# Patient Record
Sex: Male | Born: 1985 | Race: Black or African American | Hispanic: No | Marital: Single | Smoking: Never smoker
Health system: Southern US, Community
[De-identification: ages and names within clinical notes are randomized; demographics above are authoritative.]

## PROBLEM LIST (undated history)

## (undated) DIAGNOSIS — I10 Essential (primary) hypertension: Secondary | ICD-10-CM

## (undated) HISTORY — PX: HAND TENDON SURGERY: SHX663

## (undated) HISTORY — PX: ARTHROSCOPIC REPAIR ACL: SUR80

---

## 2010-02-21 ENCOUNTER — Emergency Department (HOSPITAL_COMMUNITY)
Admission: EM | Admit: 2010-02-21 | Discharge: 2010-02-22 | Disposition: A | Discharge status: Home / Self Care | Payer: BC Managed Care – PPO | Attending: Emergency Medicine | Admitting: Emergency Medicine

## 2010-02-21 ENCOUNTER — Emergency Department (HOSPITAL_COMMUNITY): Payer: BC Managed Care – PPO

## 2010-02-21 DIAGNOSIS — Y929 Unspecified place or not applicable: Secondary | ICD-10-CM | POA: Insufficient documentation

## 2010-02-21 DIAGNOSIS — S62639A Displaced fracture of distal phalanx of unspecified finger, initial encounter for closed fracture: Secondary | ICD-10-CM | POA: Insufficient documentation

## 2010-02-21 DIAGNOSIS — Z1881 Retained glass fragments: Secondary | ICD-10-CM | POA: Insufficient documentation

## 2010-02-21 DIAGNOSIS — S61409A Unspecified open wound of unspecified hand, initial encounter: Secondary | ICD-10-CM | POA: Insufficient documentation

## 2010-02-21 DIAGNOSIS — S61209A Unspecified open wound of unspecified finger without damage to nail, initial encounter: Secondary | ICD-10-CM | POA: Insufficient documentation

## 2010-02-21 DIAGNOSIS — S6710XA Crushing injury of unspecified finger(s), initial encounter: Secondary | ICD-10-CM | POA: Insufficient documentation

## 2010-02-24 ENCOUNTER — Encounter (HOSPITAL_BASED_OUTPATIENT_CLINIC_OR_DEPARTMENT_OTHER)
Admission: RE | Admit: 2010-02-24 | Discharge: 2010-02-24 | Disposition: A | Discharge status: Home / Self Care | Payer: BC Managed Care – PPO | Source: Ambulatory Visit | Attending: Orthopedic Surgery | Admitting: Orthopedic Surgery

## 2010-02-24 DIAGNOSIS — Z01810 Encounter for preprocedural cardiovascular examination: Secondary | ICD-10-CM | POA: Insufficient documentation

## 2010-02-25 ENCOUNTER — Ambulatory Visit (HOSPITAL_BASED_OUTPATIENT_CLINIC_OR_DEPARTMENT_OTHER)
Admission: RE | Admit: 2010-02-25 | Discharge: 2010-02-25 | Disposition: A | Discharge status: Home / Self Care | Payer: BC Managed Care – PPO | Source: Ambulatory Visit | Attending: Orthopedic Surgery | Admitting: Orthopedic Surgery

## 2010-02-25 DIAGNOSIS — Z1881 Retained glass fragments: Secondary | ICD-10-CM | POA: Insufficient documentation

## 2010-02-25 DIAGNOSIS — W268XXA Contact with other sharp object(s), not elsewhere classified, initial encounter: Secondary | ICD-10-CM | POA: Insufficient documentation

## 2010-02-25 DIAGNOSIS — Z01812 Encounter for preprocedural laboratory examination: Secondary | ICD-10-CM | POA: Insufficient documentation

## 2010-02-25 DIAGNOSIS — Y998 Other external cause status: Secondary | ICD-10-CM | POA: Insufficient documentation

## 2010-02-25 DIAGNOSIS — S61209A Unspecified open wound of unspecified finger without damage to nail, initial encounter: Secondary | ICD-10-CM | POA: Insufficient documentation

## 2010-02-25 LAB — POCT HEMOGLOBIN-HEMACUE: Hemoglobin: 14.9 g/dL (ref 13.0–17.0)
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
## 2015-08-07 ENCOUNTER — Emergency Department (HOSPITAL_COMMUNITY): Payer: Managed Care, Other (non HMO)

## 2015-08-07 ENCOUNTER — Encounter (HOSPITAL_COMMUNITY): Payer: Self-pay | Admitting: *Deleted

## 2015-08-07 ENCOUNTER — Emergency Department (HOSPITAL_COMMUNITY)
Admission: EM | Admit: 2015-08-07 | Discharge: 2015-08-07 | Disposition: A | Discharge status: Home / Self Care | Payer: Managed Care, Other (non HMO) | Attending: Emergency Medicine | Admitting: Emergency Medicine

## 2015-08-07 DIAGNOSIS — R079 Chest pain, unspecified: Secondary | ICD-10-CM

## 2015-08-07 DIAGNOSIS — R0789 Other chest pain: Secondary | ICD-10-CM | POA: Diagnosis present

## 2015-08-07 DIAGNOSIS — I10 Essential (primary) hypertension: Secondary | ICD-10-CM | POA: Diagnosis not present

## 2015-08-07 HISTORY — DX: Essential (primary) hypertension: I10

## 2015-08-07 MED ORDER — GI COCKTAIL ~~LOC~~
30.0000 mL | Freq: Once | ORAL | Status: AC
  Administered 2015-08-07: 30 mL via ORAL
  Filled 2015-08-07: qty 30

## 2015-08-07 NOTE — Discharge Instructions (Signed)
Try zantac 150mg twice a day.  ° ° °

## 2015-08-07 NOTE — ED Provider Notes (Signed)
[facility]-EMERGENCY DEPT Provider Note   CSN: [id number] Arrival date & time: 08/07/15  2409  First Provider Contact:  First MD Initiated Contact with Patient 08/07/15 0732        History   Chief Complaint Chief Complaint  Patient presents with  . Chest Pain    HPI Alan Weeks is a 30 y.o. male.  30 yo M with a chief complaint of chest pain. This started after he swallowed a fatty piece of chicken that he described as gristle. Since then he felt like it has gotten stuck but has been able to eat and drink without difficulty. Having pain only off and on with eating and drinking and swallowing. Denies exertional symptoms. Denies vomiting. Denies shortness of breath   The history is provided by the patient.  Chest Pain  Description: Epigastric. Severity:  Moderate Onset quality:  Sudden Duration:  2 days Timing:  Constant Progression:  Unchanged Chronicity:  New Context comment:  After swallowing some gristle Relieved by:  Nothing Worsened by:  Nothing Ineffective treatments: Eating and drinking. Associated symptoms: no chest pain, no diarrhea, no headaches, no palpitations, no shortness of breath and no vomiting     Past Medical History:  Diagnosis Date  . Hypertension     There are no active problems to display for this patient.   Past Surgical History:  Procedure Laterality Date  . ARTHROSCOPIC REPAIR ACL    . HAND TENDON SURGERY      OB History    No data available       Home Medications    Prior to Admission medications   Medication Sig Start Date End Date Taking? Authorizing Provider  olmesartan-hydrochlorothiazide (BENICAR HCT) 20-12.5 MG tablet Take 1 tablet by mouth daily. 06/02/15  Yes Historical Provider, MD    Family History No family history on file.  Social History Social History  Substance Use Topics  . Smoking status: Never Smoker  . Smokeless tobacco: Never Used  . Alcohol use Yes     Comment: rarely     Allergies   Review of  patient's allergies indicates no known allergies.   Review of Systems Review of Systems  Constitutional: Negative for chills and fever.  HENT: Negative for congestion and facial swelling.   Eyes: Negative for discharge and visual disturbance.  Respiratory: Negative for shortness of breath.   Cardiovascular: Negative for chest pain and palpitations.  Gastrointestinal: Negative for abdominal pain, diarrhea and vomiting.  Musculoskeletal: Negative for arthralgias and myalgias.  Skin: Negative for color change and rash.  Neurological: Negative for tremors, syncope and headaches.  Psychiatric/Behavioral: Negative for confusion and dysphoric mood.     Physical Exam Updated Vital Signs BP 140/93 (BP Location: Right Arm)   Pulse 63   Temp 97.7 F (36.5 C) (Oral)   Resp 14   Ht 6\' 1"  (1.854 m)   Wt 254 lb (115.2 kg)   SpO2 100%   BMI 33.51 kg/m   Physical Exam  Constitutional: He is oriented to person, place, and time. He appears well-developed and well-nourished.  HENT:  Head: Normocephalic and atraumatic.  Eyes: Conjunctivae and EOM are normal. Pupils are equal, round, and reactive to light.  Neck: Normal range of motion. No JVD present.  Cardiovascular: Normal rate and regular rhythm.   Pulmonary/Chest: Effort normal. No stridor. No respiratory distress.  Abdominal: He exhibits no distension. There is no tenderness. There is no guarding.  Musculoskeletal: Normal range of motion. He exhibits no edema.  Neurological: He  is alert and oriented to person, place, and time.  Skin: Skin is warm and dry.  Psychiatric: He has a normal mood and affect. His behavior is normal.     ED Treatments / Results  Labs (all labs ordered are listed, but only abnormal results are displayed) Labs Reviewed - No data to display  EKG  EKG Interpretation  Date/Time:  Friday August 07 2015 06:00:02 EDT Ventricular Rate:  75 PR Interval:    QRS Duration: 108 QT Interval:  361 QTC  Calculation: 404 R Axis:   34 Text Interpretation:  Sinus rhythm Nonspecific T abnormalities, diffuse leads No significant change since last tracing Confirmed by Derrek Puff MD, DANIEL [phone]) on 08/07/2015 8:22:43 AM       Radiology Dg Chest 2 View  Result Date: 08/07/2015 CLINICAL DATA:  Chest pain. EXAM: CHEST  2 VIEW COMPARISON:  None. FINDINGS: The heart size and mediastinal contours are within normal limits. Both lungs are clear. No pneumothorax or pleural effusion is noted. The visualized skeletal structures are unremarkable. IMPRESSION: No active cardiopulmonary disease. Electronically Signed   By: Lupita Raider, M.D.   On: 08/07/2015 07:24    Procedures Procedures (including critical care time)  Medications Ordered in ED Medications  gi cocktail (Maalox,Lidocaine,Donnatal) (30 mLs Oral Given 08/07/15 0720)     Initial Impression / Assessment and Plan / ED Course  I have reviewed the triage vital signs and the nursing notes.  Pertinent labs & imaging results that were available during my care of the patient were reviewed by me and considered in my medical decision making (see chart for details).  Clinical Course    30 yo M With a chief complaint of odonyphagia. This was after eating. Since then he has been able to eat and drink without difficulty. EKG and chest x-ray unremarkable. I see no foreign body on x-ray. As the patient is able to eat and drink without follow with GI for possible scope should his symptoms continue.  8:22 AM:  I have discussed the diagnosis/risks/treatment options with the patient and believe the pt to be eligible for discharge home to follow-up with GI. We also discussed returning to the ED immediately if new or worsening sx occur. We discussed the sx which are most concerning (e.g., sudden worsening pain, fever, inability to tolerate by mouth) that necessitate immediate return. Medications administered to the patient during their visit and any new prescriptions  provided to the patient are listed below.  Medications given during this visit Medications  gi cocktail (Maalox,Lidocaine,Donnatal) (30 mLs Oral Given 08/07/15 0720)     The patient appears reasonably screen and/or stabilized for discharge and I doubt any other medical condition or other [facility] requiring further screening, evaluation, or treatment in the ED at this time prior to discharge.    Final Clinical Impressions(s) / ED Diagnoses   Final diagnoses:  Nonspecific chest pain    New Prescriptions Discharge Medication List as of 08/07/2015  8:03 AM       Melene Plan, DO 08/07/15 [id number]

## 2015-08-07 NOTE — ED Triage Notes (Signed)
Pt states that he was eating chicken 2-3 days ago and was eating the chicken gristle; pt states that he swallowed a piece of gristle and states that is was painful and uncomfortable to swallow; pt states that he still feels some discomfort at times and feels like he "really" has to swallow to get food down since the incident; pt c/o discomfort when taking a deep breath to lower sternum area

## 2017-09-03 IMAGING — CR DG CHEST 2V
2 series · 2 of 2 positions shown · non-contrast
Comparison: None.

CLINICAL DATA: Chest pain.

EXAM:
CHEST  2 VIEW

[w chest pa]
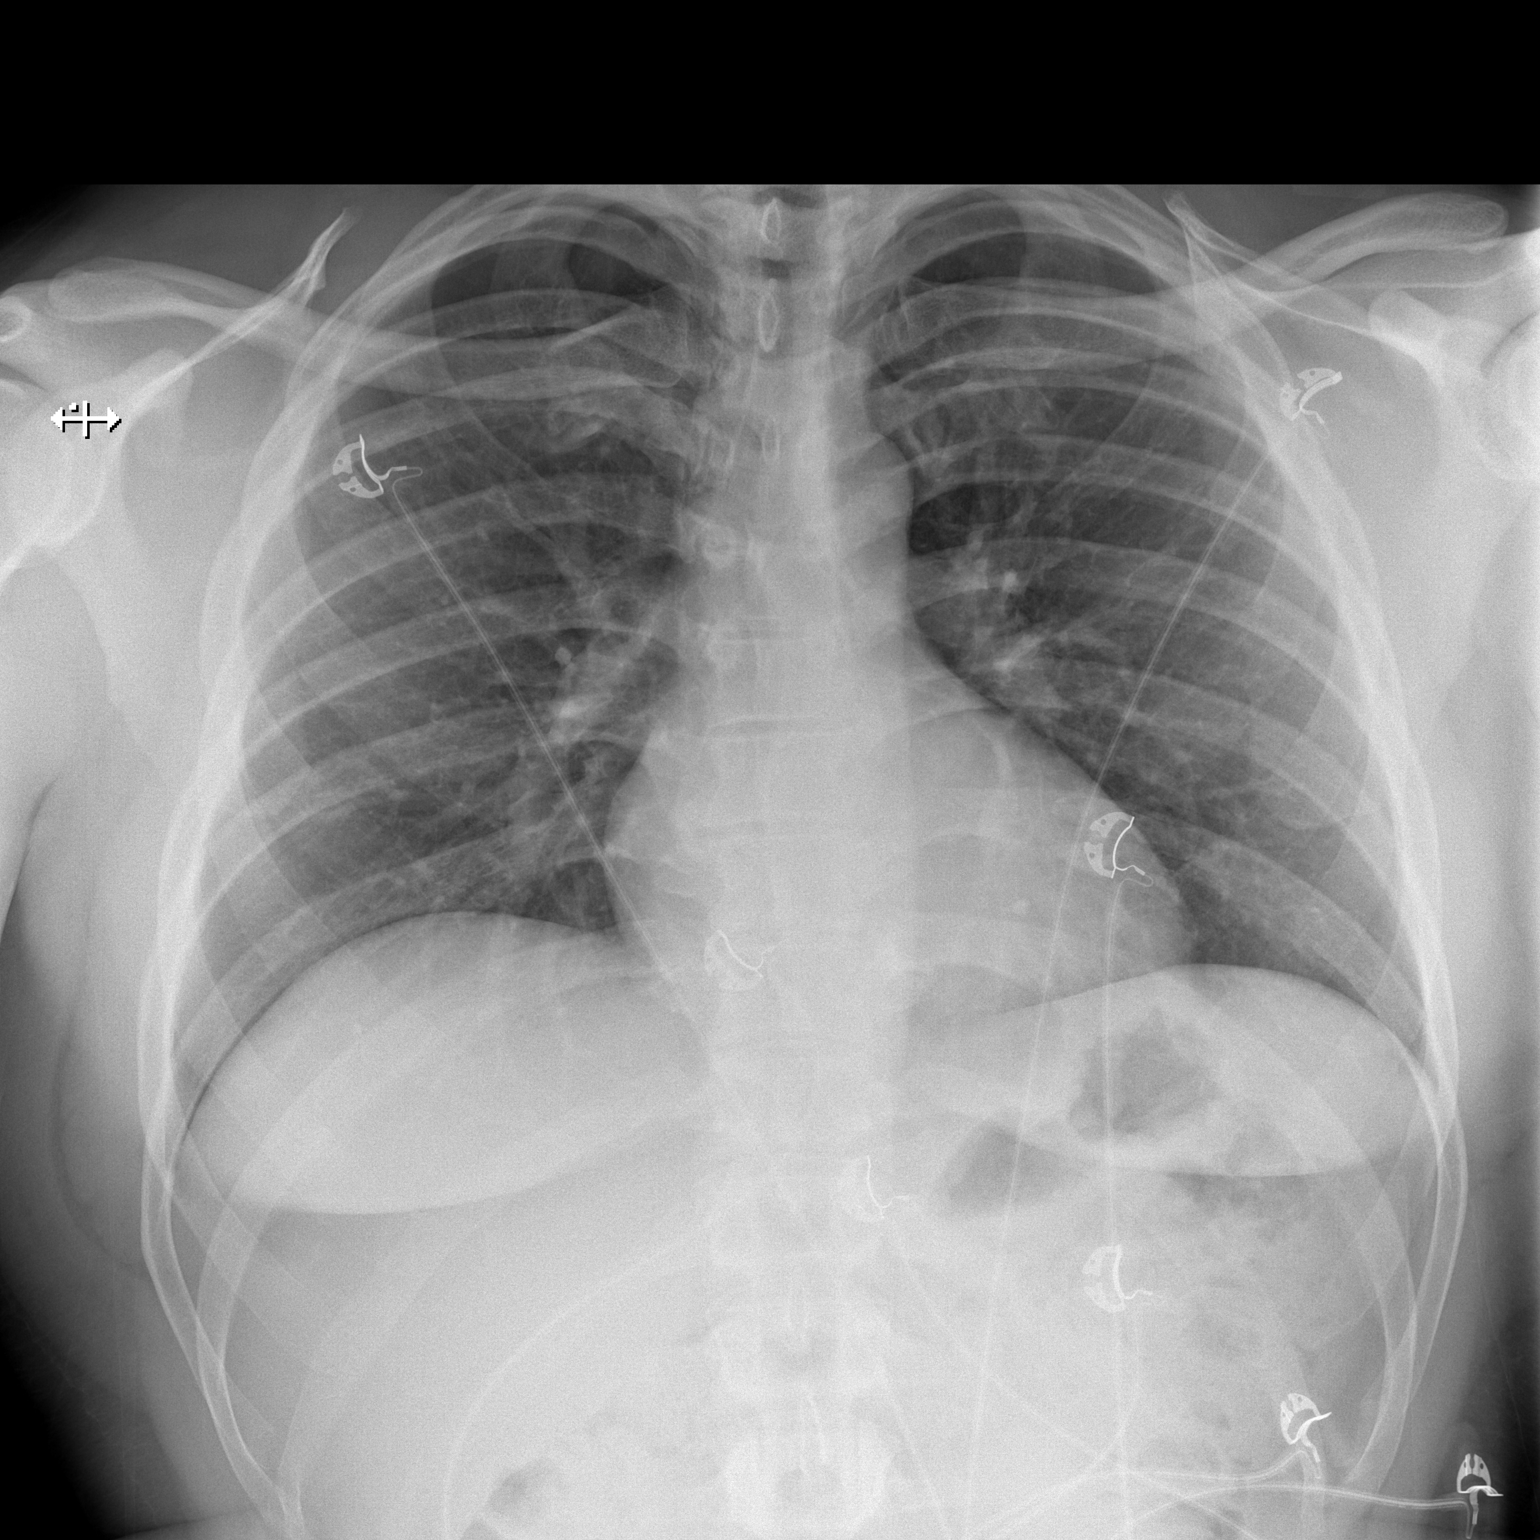

[w chest lat]
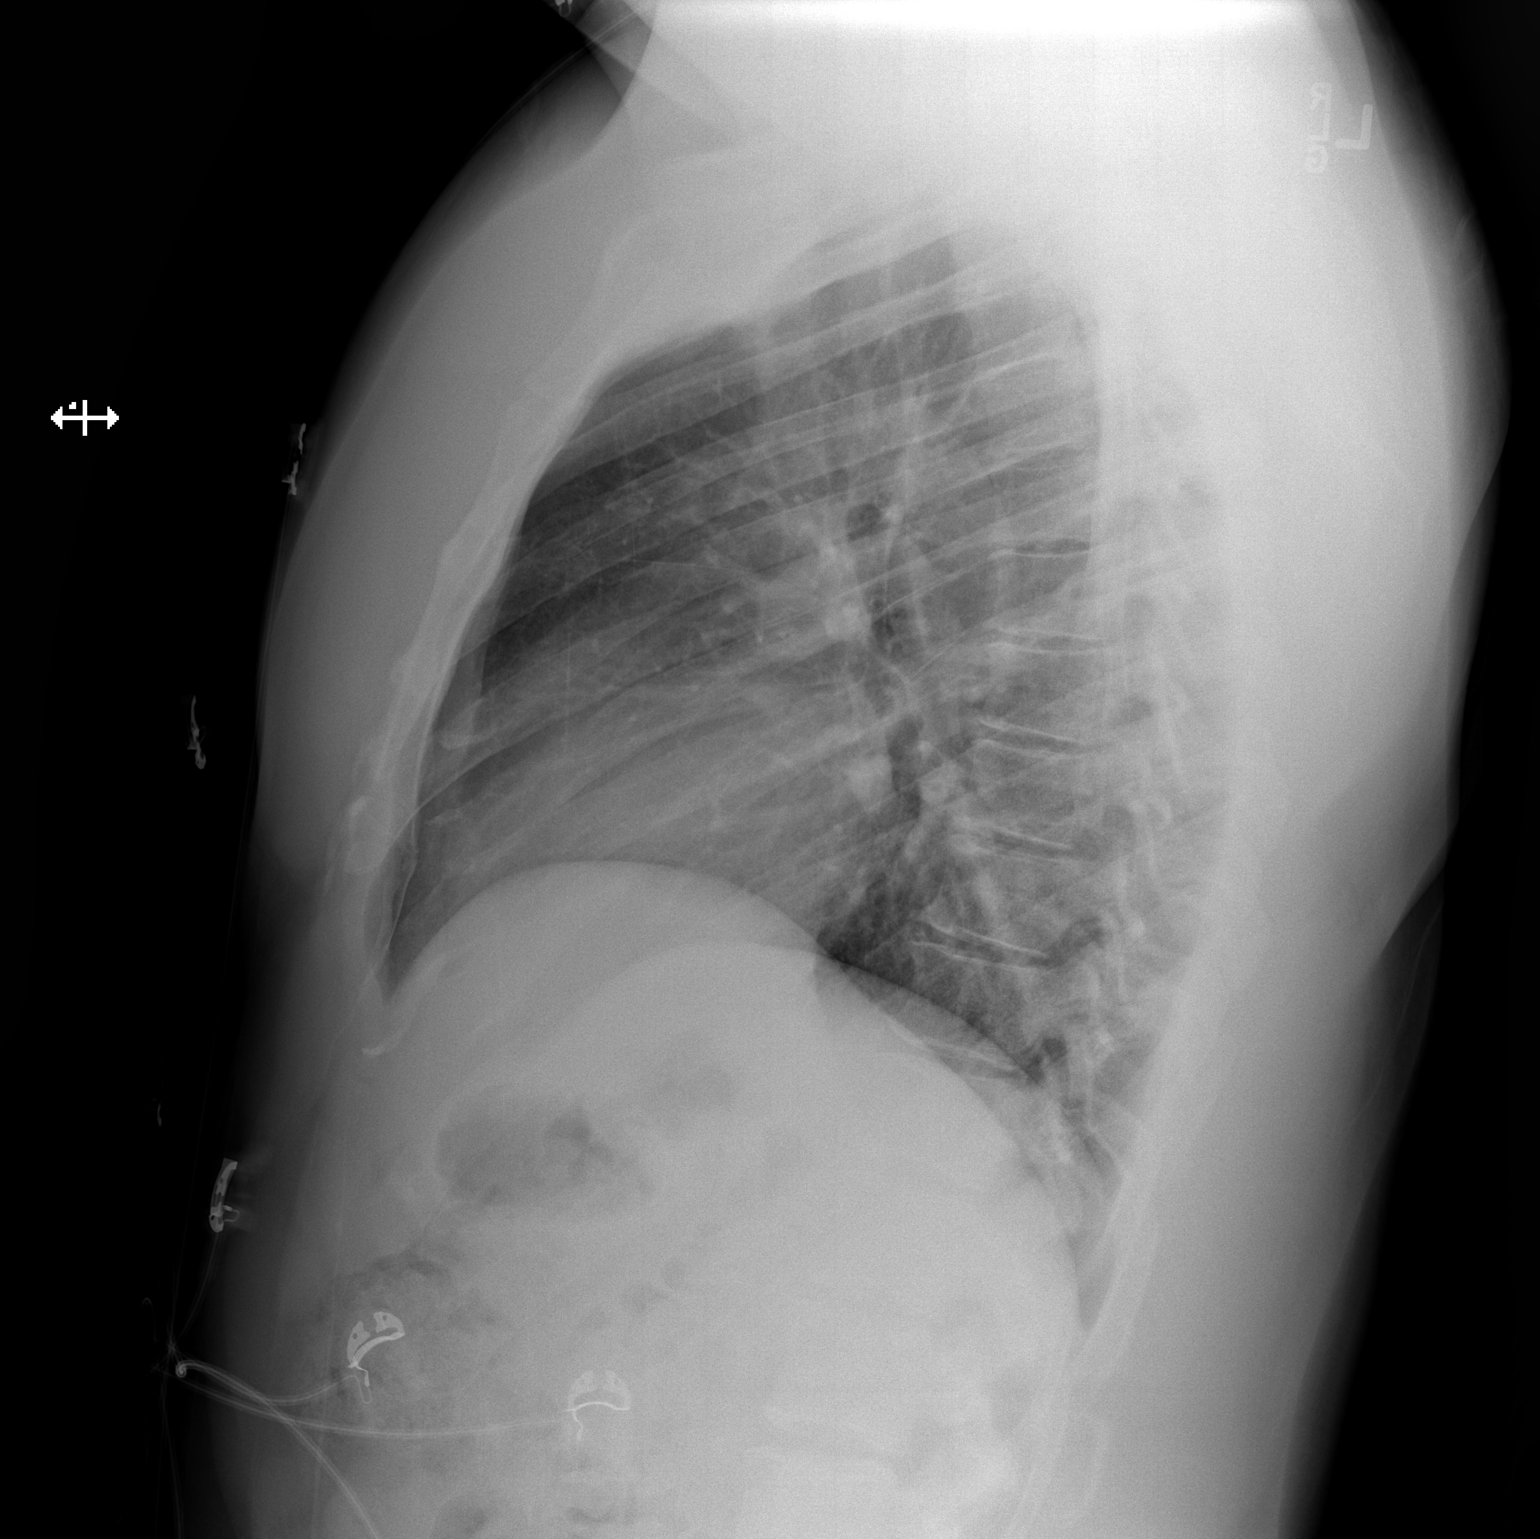

[2 of 2 positions shown; findings below may reference images not displayed]

FINDINGS: The heart size and mediastinal contours are within normal limits.
Both lungs are clear. No pneumothorax or pleural effusion is noted.
The visualized skeletal structures are unremarkable.
IMPRESSION: No active cardiopulmonary disease.
